# Patient Record
Sex: Female | Born: 1990 | Race: White | Hispanic: No | Marital: Married | Smoking: Never smoker
Health system: Southern US, Community
[De-identification: ages and names within clinical notes are randomized; demographics above are authoritative.]

---

## 2013-05-03 HISTORY — PX: AUGMENTATION MAMMAPLASTY: SUR837

## 2020-07-25 ENCOUNTER — Other Ambulatory Visit: Payer: Self-pay | Admitting: Family Medicine

## 2020-07-25 DIAGNOSIS — N644 Mastodynia: Secondary | ICD-10-CM

## 2020-08-04 ENCOUNTER — Ambulatory Visit
Admission: RE | Admit: 2020-08-04 | Discharge: 2020-08-04 | Disposition: A | Discharge status: Home / Self Care | Payer: BC Managed Care – PPO | Source: Ambulatory Visit | Attending: Family Medicine | Admitting: Family Medicine

## 2020-08-04 ENCOUNTER — Other Ambulatory Visit: Payer: Self-pay | Admitting: Family Medicine

## 2020-08-04 ENCOUNTER — Other Ambulatory Visit: Payer: Self-pay

## 2020-08-04 DIAGNOSIS — N644 Mastodynia: Secondary | ICD-10-CM

## 2020-08-07 ENCOUNTER — Other Ambulatory Visit: Payer: BC Managed Care – PPO

## 2020-08-08 ENCOUNTER — Other Ambulatory Visit: Payer: Self-pay | Admitting: Family Medicine

## 2020-08-08 DIAGNOSIS — N6320 Unspecified lump in the left breast, unspecified quadrant: Secondary | ICD-10-CM

## 2020-08-08 DIAGNOSIS — R928 Other abnormal and inconclusive findings on diagnostic imaging of breast: Secondary | ICD-10-CM

## 2020-09-08 ENCOUNTER — Ambulatory Visit: Payer: BC Managed Care – PPO

## 2021-03-16 ENCOUNTER — Other Ambulatory Visit: Payer: Self-pay | Admitting: Physician Assistant

## 2021-03-16 ENCOUNTER — Other Ambulatory Visit (HOSPITAL_COMMUNITY): Payer: Self-pay | Admitting: Physician Assistant

## 2021-03-16 DIAGNOSIS — M5412 Radiculopathy, cervical region: Secondary | ICD-10-CM

## 2021-03-16 DIAGNOSIS — M542 Cervicalgia: Secondary | ICD-10-CM

## 2021-03-25 ENCOUNTER — Ambulatory Visit: Payer: BC Managed Care – PPO

## 2021-07-12 ENCOUNTER — Other Ambulatory Visit: Payer: Self-pay | Admitting: Internal Medicine

## 2021-07-12 DIAGNOSIS — Z1231 Encounter for screening mammogram for malignant neoplasm of breast: Secondary | ICD-10-CM

## 2021-07-12 DIAGNOSIS — N6321 Unspecified lump in the left breast, upper outer quadrant: Secondary | ICD-10-CM

## 2021-08-06 ENCOUNTER — Other Ambulatory Visit: Payer: Self-pay

## 2021-08-06 ENCOUNTER — Ambulatory Visit
Admission: RE | Admit: 2021-08-06 | Discharge: 2021-08-06 | Disposition: A | Discharge status: Home / Self Care | Payer: BC Managed Care – PPO | Source: Ambulatory Visit | Attending: Internal Medicine | Admitting: Internal Medicine

## 2021-08-06 DIAGNOSIS — Z1231 Encounter for screening mammogram for malignant neoplasm of breast: Secondary | ICD-10-CM | POA: Diagnosis present

## 2021-08-06 DIAGNOSIS — N6321 Unspecified lump in the left breast, upper outer quadrant: Secondary | ICD-10-CM

## 2021-08-21 ENCOUNTER — Emergency Department
Admission: EM | Admit: 2021-08-21 | Discharge: 2021-08-21 | Disposition: A | Discharge status: Home / Self Care | Payer: BC Managed Care – PPO | Attending: Emergency Medicine | Admitting: Emergency Medicine

## 2021-08-21 ENCOUNTER — Emergency Department: Payer: BC Managed Care – PPO

## 2021-08-21 ENCOUNTER — Other Ambulatory Visit: Payer: Self-pay

## 2021-08-21 ENCOUNTER — Encounter: Payer: Self-pay | Admitting: Intensive Care

## 2021-08-21 DIAGNOSIS — M940 Chondrocostal junction syndrome [Tietze]: Secondary | ICD-10-CM | POA: Diagnosis not present

## 2021-08-21 DIAGNOSIS — R0789 Other chest pain: Secondary | ICD-10-CM | POA: Diagnosis present

## 2021-08-21 LAB — BASIC METABOLIC PANEL
Anion gap: 4 — ABNORMAL LOW (ref 5–15)
BUN: 21 mg/dL — ABNORMAL HIGH (ref 6–20)
CO2: 25 mmol/L (ref 22–32)
Calcium: 9 mg/dL (ref 8.9–10.3)
Chloride: 107 mmol/L (ref 98–111)
Creatinine, Ser: 0.66 mg/dL (ref 0.44–1.00)
GFR, Estimated: >60 mL/min (ref >60)
Glucose, Bld: 106 mg/dL — ABNORMAL HIGH (ref 70–99)
Potassium: 3.6 mmol/L (ref 3.5–5.1)
Sodium: 136 mmol/L (ref 135–145)

## 2021-08-21 LAB — CBC
HCT: 40.7 % (ref 36.0–46.0)
Hemoglobin: 13.8 g/dL (ref 12.0–15.0)
MCH: 31.4 pg (ref 26.0–34.0)
MCHC: 33.9 g/dL (ref 30.0–36.0)
MCV: 92.5 fL (ref 80.0–100.0)
Platelets: 287 10*3/uL (ref 150–400)
RBC: 4.4 MIL/uL (ref 3.87–5.11)
RDW: 11.7 % (ref 11.5–15.5)
WBC: 10.3 10*3/uL (ref 4.0–10.5)
nRBC: 0 % (ref 0.0–0.2)

## 2021-08-21 LAB — TROPONIN I (HIGH SENSITIVITY)
Troponin I (High Sensitivity): <2 ng/L (ref <18)
Troponin I (High Sensitivity): <2 ng/L (ref <18)

## 2021-08-21 NOTE — ED Provider Notes (Signed)
[facility] Emergency Department Provider Note    ____________________________________________   Event Date/Time   First MD Initiated Contact with Patient 08/21/21 1946     (approximate)  I have reviewed the triage vital signs and the nursing notes.   HISTORY  Chief Complaint Chest Pain   HPI Tiffany French is a 30 y.o. female, history of anxiety, presents the emergency department for evaluation of chest pain.  Patient states that this been going on since 08/11/2021.  She states that she woke up with the pain.  Describes as a dull, pressure-like sensation centrally in her chest.  Reports pain when pressing it with her fingers and reports relief when taking a deep breath.  She states that she has tried several different medications including Mucinex, Prilosec, Tylenol, and her anxiety medication to try to figure out what it is and none of these have worked.  Denies fever/chills, abdominal pain, shortness of breath, flank pain, or urinary symptoms.   History limited by: No limitations.  History reviewed. No pertinent past medical history.  There are no problems to display for this patient.   Past Surgical History:  Procedure Laterality Date   AUGMENTATION MAMMAPLASTY Bilateral 05/2013   saline    Prior to Admission medications   Not on File    Allergies Sulfa antibiotics  History reviewed. No pertinent family history.  Social History Social History   Tobacco Use   Smoking status: Never   Smokeless tobacco: Never  Vaping Use   Vaping Use: Never used  Substance Use Topics   Alcohol use: Yes    Alcohol/week: 4.0 standard drinks    Types: 4 Glasses of wine per week   Drug use: Not Currently    Review of Systems Review of Systems  Constitutional:  Negative for chills and fever.  HENT:  Negative for ear pain and sore throat.   Eyes:  Negative for blurred vision.  Respiratory:  Negative for sputum production and shortness of breath.    Cardiovascular:  Positive for chest pain. Negative for leg swelling.  Gastrointestinal:  Negative for abdominal pain and vomiting.  Genitourinary:  Negative for dysuria, flank pain and hematuria.  Musculoskeletal:  Negative for myalgias.  Skin:  Negative for rash.  Neurological:  Negative for headaches.    10-point ROS otherwise negative. ____________________________________________   PHYSICAL EXAM:  VITAL SIGNS: ED Triage Vitals  Enc Vitals Group     BP --      Pulse --      Resp --      Temp 08/21/21 1749 98.4 F (36.9 C)     Temp Source 08/21/21 1749 Oral     SpO2 --      Weight 08/21/21 1747 136 lb (61.7 kg)     Height 08/21/21 1747 5\' 6"  (1.676 m)     Head Circumference --      Peak Flow --      Pain Score 08/21/21 1747 6     Pain Loc --      Pain Edu? --      Excl. in [place]? --     Physical Exam Vitals and nursing note reviewed.  HENT:     Head: Normocephalic and atraumatic.     Right Ear: External ear normal.     Left Ear: External ear normal.     Nose: Nose normal.  Eyes:     Conjunctiva/sclera: Conjunctivae normal.  Cardiovascular:     Rate and Rhythm: Normal rate.  Comments: Notable tenderness when palpating the lateral edges of the sternum.  Pulmonary:     Effort: Pulmonary effort is normal.  Abdominal:     General: There is no distension.  Musculoskeletal:        General: No deformity.  Skin:    General: Skin is warm.  Neurological:     Mental Status: She is alert.  Psychiatric:        Mood and Affect: Mood normal.     ____________________________________________    LABS  (all labs ordered are listed, but only abnormal results are displayed)  Labs Reviewed  BASIC METABOLIC PANEL - Abnormal; Notable for the following components:      Result Value   Glucose, Bld 106 (*)    BUN 21 (*)    Anion gap 4 (*)    All other components within normal limits  CBC  POC URINE PREG, ED  POC URINE PREG, ED  TROPONIN I (HIGH SENSITIVITY)   TROPONIN I (HIGH SENSITIVITY)     ____________________________________________   EKG Sinus rhythm, rate of 62, no ST segment changes, no AV blocks, no axis deviation.   ____________________________________________    RADIOLOGY I personally viewed and evaluated these images as part of my medical decision making, as well as reviewing the written report by the radiologist.  ED Provider Interpretation: I agree with the interpretation by the radiologist.  DG Chest 2 View  Result Date: 08/21/2021 CLINICAL DATA:  Chest pain EXAM: CHEST - 2 VIEW COMPARISON:  None. FINDINGS: The heart size and mediastinal contours are within normal limits. Both lungs are clear. The visualized skeletal structures are unremarkable. IMPRESSION: No active cardiopulmonary disease. Electronically Signed   By: Keane Police D.O.   On: 08/21/2021 18:28    ____________________________________________   PROCEDURES  Procedures   Medications - No data to display  Critical Care performed: No  ____________________________________________   INITIAL IMPRESSION / ASSESSMENT AND PLAN / ED COURSE  Pertinent labs & imaging results that were available during my care of the patient were reviewed by me and considered in my medical decision making (see chart for details).       Tiffany French is a 30 y.o. female, history of anxiety, presents the emergency department for evaluation of chest pain.  Patient states that this been going on since 08/11/2021.  She states that she woke up with the pain.  Describes as a dull, pressure-like sensation centrally in her chest.  Reports pain when pressing it with her fingers and reports relief when taking a deep breath.  She states that she has tried several different medications including Mucinex, Prilosec, Tylenol, and her anxiety medication to try to figure out what it is and none of these have worked.  Denies fever/chills, abdominal pain, shortness of breath, flank pain, or urinary  symptoms.  Differentials included, but not limited to: Serious: aortic dissection, ACS, pulmonary embolism, cardiac tamponade, Boerhaave syndrome, pneumothorax, myocarditis, pericarditis, acute chest syndrome, aortic stenosis. Common/Non-emergent: gastritis, esophagitis, costochondritis, pneumonia, anxiety   Upon entering the room, patient appears well.  She is sitting upright in the bed.  NAD.  Physical exam is notable for chest wall tenderness when palpating the lateral borders of the sternum.  Otherwise unremarkable.  Lung sounds are clear bilaterally.  Vital signs are within normal limits.  CBC shows no leukocytosis or anemia.  BMP unremarkable.  Troponin less than 2.  ECG shows sinus rhythm with no ST segment changes. Chest x-ray shows no active cardiopulmonary disease.  Given the patient's history, physical exam, and work-up, I suspect that the patient is likely experiencing costochondritis.  No life-threatening pathology suspected.  Discussed these findings with the patient.  We will plan to discharge with anticipatory guidance and strict return precautions.  Advised the patient to treat with ibuprofen or naproxen as needed.  Encouraged her to return to the ED at anytime if she experiences new or worsening symptoms.  Advised her to follow-up with her PCP within 1 week to ensure resolution or improvement.       ____________________________________________   FINAL CLINICAL IMPRESSION(S) / ED DIAGNOSES  Final diagnoses:  Costochondritis     NEW MEDICATIONS STARTED DURING THIS VISIT:  ED Discharge Orders     None        Note:  This document was prepared using Dragon voice recognition software and may include unintentional dictation errors.    Varney Daily, [place] 08/21/21 2024    Gilles Chiquito, MD 08/21/21 2049

## 2021-08-21 NOTE — ED Triage Notes (Signed)
Patient c/o central chest pain since 08/11/21. Describes it as pressure and constant

## 2021-11-19 ENCOUNTER — Other Ambulatory Visit: Payer: Self-pay

## 2021-11-19 ENCOUNTER — Ambulatory Visit
Admission: RE | Admit: 2021-11-19 | Discharge: 2021-11-19 | Disposition: A | Discharge status: Home / Self Care | Payer: BC Managed Care – PPO | Source: Ambulatory Visit | Attending: Physician Assistant | Admitting: Physician Assistant

## 2021-11-19 DIAGNOSIS — M542 Cervicalgia: Secondary | ICD-10-CM | POA: Insufficient documentation

## 2021-11-19 DIAGNOSIS — M5412 Radiculopathy, cervical region: Secondary | ICD-10-CM | POA: Insufficient documentation

## 2022-09-03 IMAGING — MR MR CERVICAL SPINE W/O CM
5 series · 38 of 48 positions shown · non-contrast
Comparison: None.

CLINICAL DATA: Chronic left-sided neck, upper back and under left
scapula with occasional right-sided pain.

EXAM:
MRI CERVICAL SPINE WITHOUT CONTRAST
TECHNIQUE: Multiplanar, multisequence MR imaging of the cervical spine was
performed. No intravenous contrast was administered.

[Series 5: T2 · sagittal · 3.0mm · 0.62mm/px · 6 of 15 slices shown (1 of 2)]
[im 1/15]
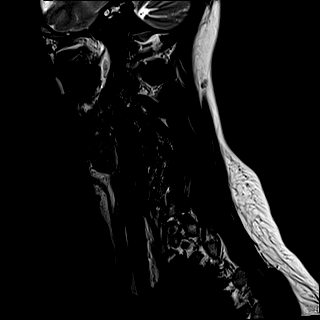
[im 3/15]
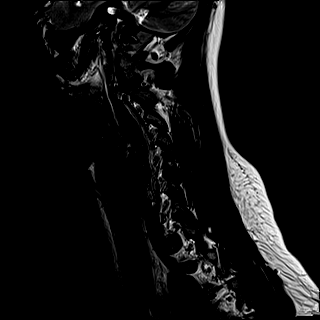
[im 6/15]
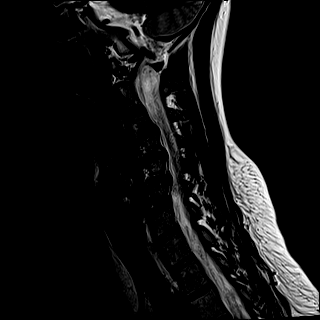
[im 9/15]
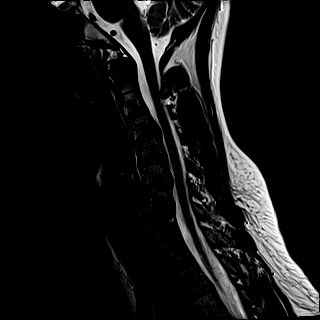
[im 12/15]
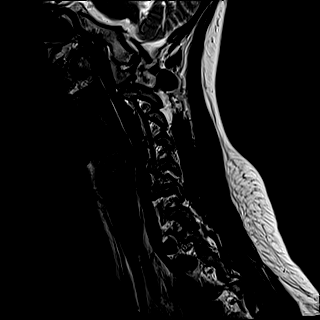
[im 15/15]
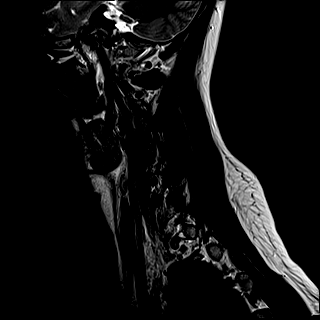

[Series 6: FLAIR · sagittal · 3.0mm · 0.78mm/px · 7 of 15 slices shown]
[im 1/15]
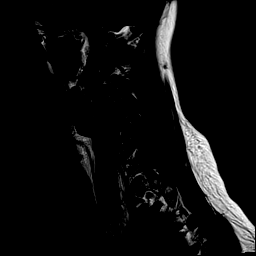
[im 3/15]
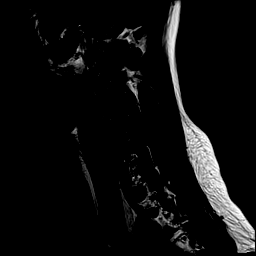
[im 5/15]
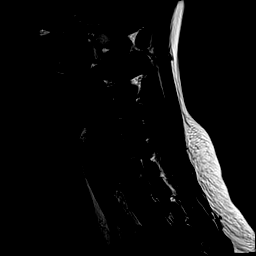
[im 8/15]
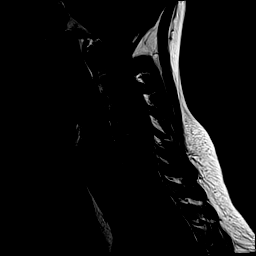
[im 10/15]
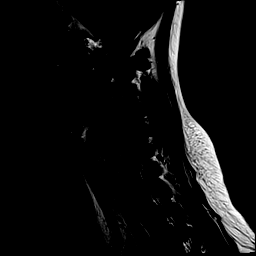
[im 12/15]
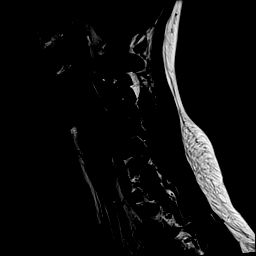
[im 15/15]
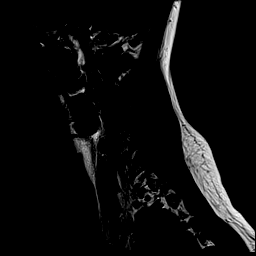

[Series 7: STIR · sagittal · 3.0mm · 0.62mm/px · 7 of 15 slices shown]
[im 1/15]
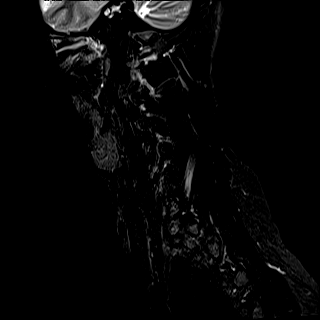
[im 3/15]
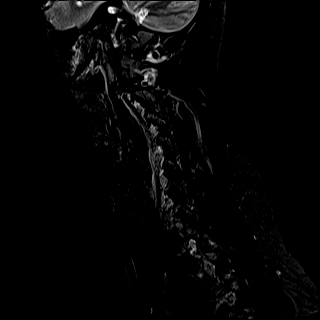
[im 5/15]
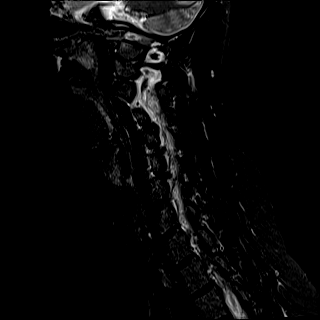
[im 8/15]
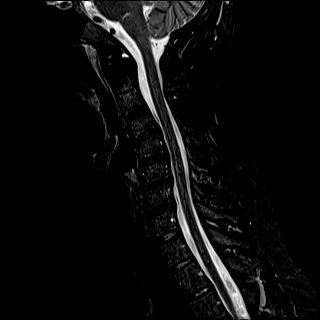
[im 10/15]
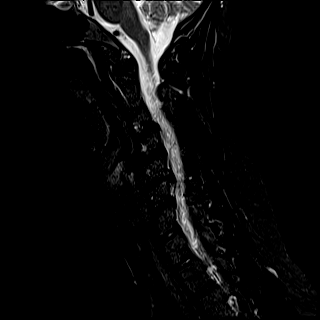
[im 12/15]
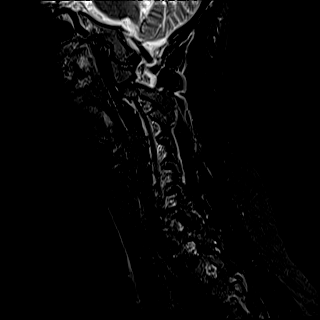
[im 15/15]
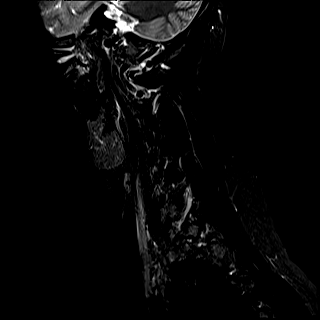

[Series 8: T2 · axial · 3.0mm · 0.70mm/px · z∈[-81,+13]mm · 10 of 29 slices shown (2 of 2)]
[im 1/29]
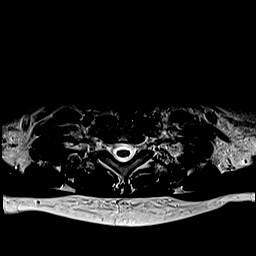
[im 3/29]
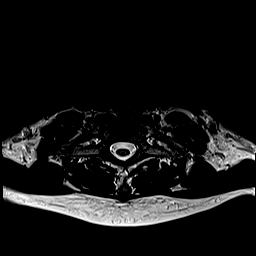
[im 5/29]
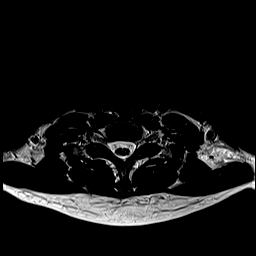
[im 7/29]
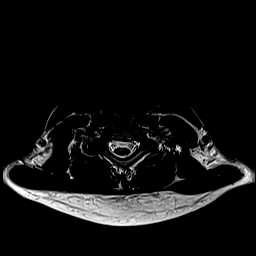
[im 9/29]
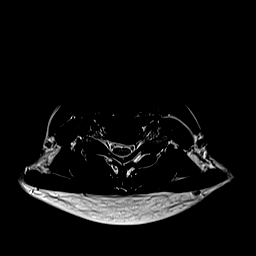
[im 13/29]
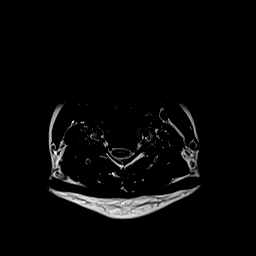
[im 16/29]
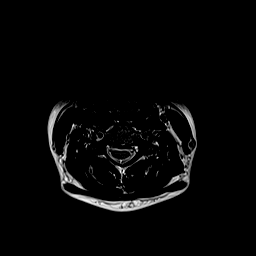
[im 20/29]
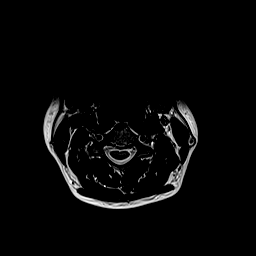
[im 24/29]
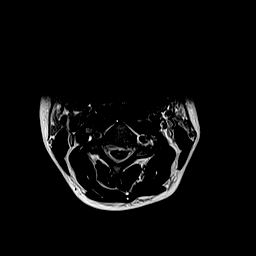
[im 29/29]
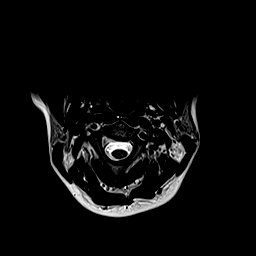

[Series 9: ax mpgr · axial · 3.0mm · 0.35mm/px · z∈[-81,+13]mm · 8 of 29 slices shown]
[im 1/29]
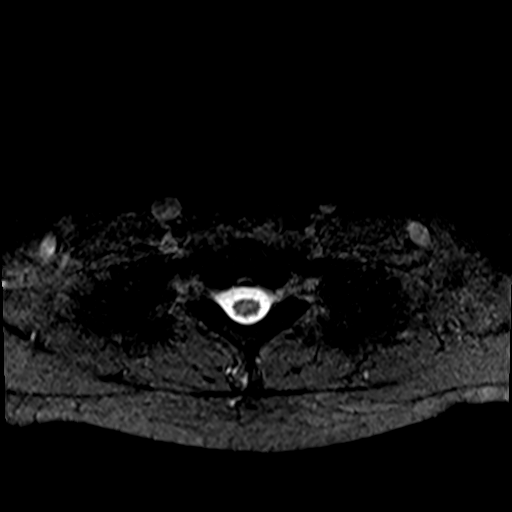
[im 5/29]
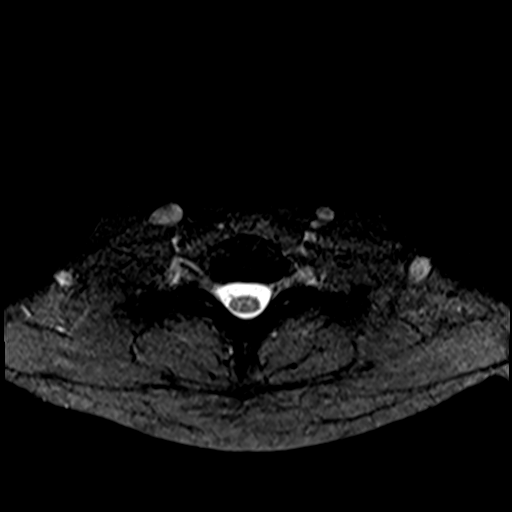
[im 9/29]
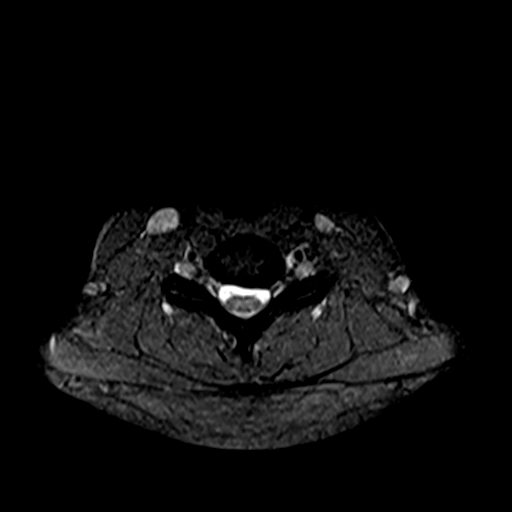
[im 13/29]
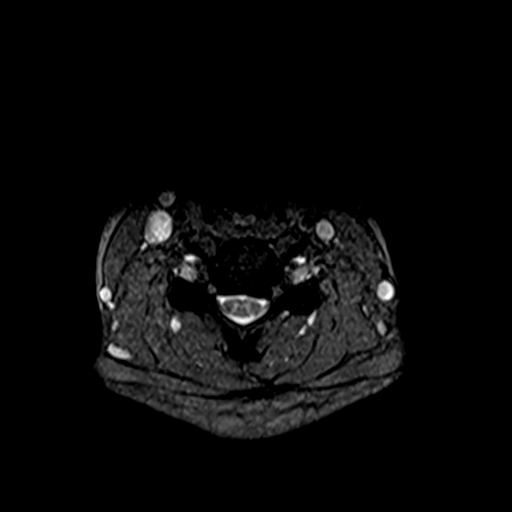
[im 16/29]
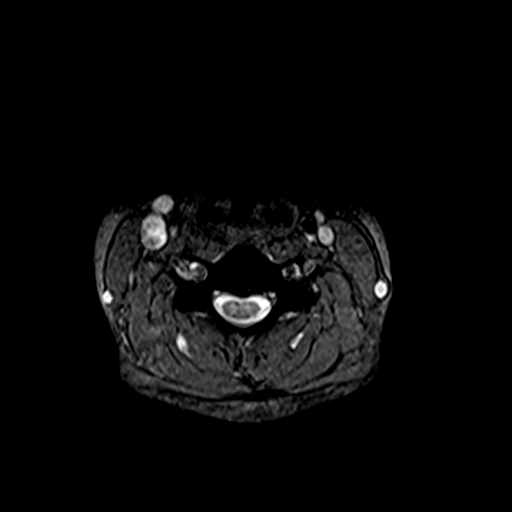
[im 20/29]
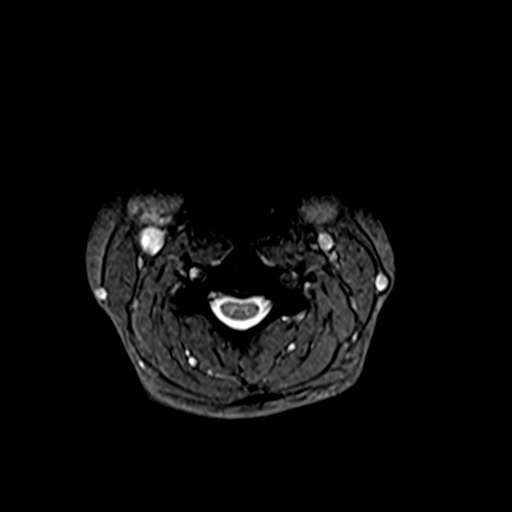
[im 24/29]
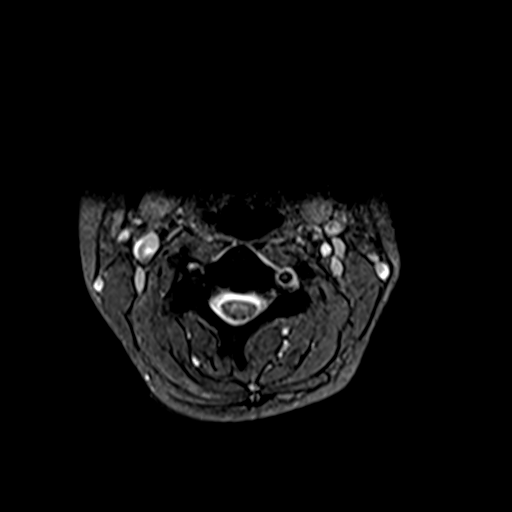
[im 29/29]
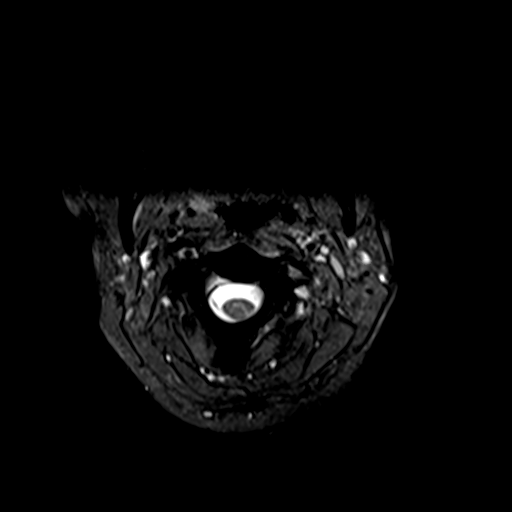

[38 of 48 positions shown; findings below may reference images not displayed]

FINDINGS: Alignment: There is straightening of the normal cervical lordosis
with reversal centered at C4. There is no antero or retrolisthesis.

Vertebrae: Vertebral body heights are preserved. Marrow signal is
normal. There is no suspicious signal abnormality or marrow edema.

Cord: Normal in signal and morphology.

Posterior Fossa, vertebral arteries, paraspinal tissues: Imaged
posterior fossa is unremarkable. The vertebral artery flow voids are
normal. The paraspinal soft tissues are unremarkable.

Disc levels:

There is mild disc desiccation and narrowing at C5-C6. The other
disc heights are preserved.

C2-C3: No significant spinal canal or neural foraminal stenosis

C3-C4: There is mild bilateral uncovertebral ridging without
significant spinal canal or neural foraminal stenosis

C4-C5: There is mild right worse than left uncovertebral ridging
without significant spinal canal or neural foraminal stenosis

C5-C6: There is prominent left worse than right uncovertebral
ridging and mild bilateral facet arthropathy resulting in moderate
left worse than right neural foraminal stenosis without significant
spinal canal stenosis

C6-C7: Minimal uncovertebral arthropathy without significant spinal
canal or neural foraminal stenosis

C7-T1: No significant spinal canal or neural foraminal stenosis.
IMPRESSION: 1. Mild degenerative changes in the cervical spine, most advanced at
C5-C6 where there is moderate left worse than right neural foraminal
stenosis without significant spinal canal stenosis.
2. Slight reversal of the normal cervical spine lordosis centered at
C4, nonspecific.
